# Patient Record
Sex: Male | Born: 1965 | Race: Black or African American | Hispanic: No | Smoking: Never smoker
Health system: Southern US, Community
[De-identification: ages and names within clinical notes are randomized; demographics above are authoritative.]

## PROBLEM LIST (undated history)

## (undated) DIAGNOSIS — R3915 Urgency of urination: Secondary | ICD-10-CM

## (undated) DIAGNOSIS — N411 Chronic prostatitis: Secondary | ICD-10-CM

## (undated) DIAGNOSIS — E669 Obesity, unspecified: Secondary | ICD-10-CM

## (undated) HISTORY — DX: Obesity, unspecified: E66.9

## (undated) HISTORY — DX: Chronic prostatitis: N41.1

## (undated) HISTORY — DX: Urgency of urination: R39.15

---

## 1999-05-04 ENCOUNTER — Encounter: Admission: RE | Admit: 1999-05-04 | Discharge: 1999-05-04 | Payer: Self-pay | Admitting: General Practice

## 1999-05-04 ENCOUNTER — Encounter: Payer: Self-pay | Admitting: General Practice

## 2001-12-05 ENCOUNTER — Encounter: Admission: RE | Admit: 2001-12-05 | Discharge: 2001-12-05 | Payer: Self-pay | Admitting: Internal Medicine

## 2001-12-05 ENCOUNTER — Encounter: Payer: Self-pay | Admitting: Internal Medicine

## 2003-05-19 ENCOUNTER — Emergency Department (HOSPITAL_COMMUNITY): Admission: EM | Admit: 2003-05-19 | Discharge: 2003-05-19 | Payer: Self-pay

## 2004-07-11 ENCOUNTER — Emergency Department (HOSPITAL_COMMUNITY): Admission: EM | Admit: 2004-07-11 | Discharge: 2004-07-12 | Payer: Self-pay | Admitting: Emergency Medicine

## 2012-07-09 ENCOUNTER — Ambulatory Visit
Admission: RE | Admit: 2012-07-09 | Discharge: 2012-07-09 | Disposition: A | Discharge status: Home / Self Care | Payer: No Typology Code available for payment source | Source: Ambulatory Visit | Attending: General Practice | Admitting: General Practice

## 2012-07-09 ENCOUNTER — Other Ambulatory Visit: Payer: Self-pay | Admitting: General Practice

## 2012-07-09 DIAGNOSIS — R52 Pain, unspecified: Secondary | ICD-10-CM

## 2012-07-09 DIAGNOSIS — T148XXA Other injury of unspecified body region, initial encounter: Secondary | ICD-10-CM

## 2012-07-29 ENCOUNTER — Ambulatory Visit: Payer: Self-pay

## 2012-07-30 ENCOUNTER — Ambulatory Visit: Payer: Self-pay | Attending: General Practice | Admitting: Physical Therapy

## 2012-07-30 DIAGNOSIS — R293 Abnormal posture: Secondary | ICD-10-CM | POA: Insufficient documentation

## 2012-07-30 DIAGNOSIS — M2550 Pain in unspecified joint: Secondary | ICD-10-CM | POA: Insufficient documentation

## 2012-07-30 DIAGNOSIS — IMO0001 Reserved for inherently not codable concepts without codable children: Secondary | ICD-10-CM | POA: Insufficient documentation

## 2012-07-31 ENCOUNTER — Ambulatory Visit: Payer: Self-pay | Admitting: Physical Therapy

## 2012-08-04 ENCOUNTER — Ambulatory Visit: Payer: Self-pay | Admitting: Physical Therapy

## 2012-08-06 ENCOUNTER — Ambulatory Visit: Payer: Self-pay | Admitting: Rehabilitation

## 2012-08-11 ENCOUNTER — Ambulatory Visit: Payer: No Typology Code available for payment source | Attending: General Practice | Admitting: Rehabilitation

## 2012-08-11 DIAGNOSIS — IMO0001 Reserved for inherently not codable concepts without codable children: Secondary | ICD-10-CM | POA: Insufficient documentation

## 2012-08-11 DIAGNOSIS — M2550 Pain in unspecified joint: Secondary | ICD-10-CM | POA: Insufficient documentation

## 2012-08-11 DIAGNOSIS — R293 Abnormal posture: Secondary | ICD-10-CM | POA: Insufficient documentation

## 2012-08-13 ENCOUNTER — Ambulatory Visit: Payer: Self-pay | Admitting: Physical Therapy

## 2012-08-18 ENCOUNTER — Ambulatory Visit: Payer: Self-pay | Admitting: Physical Therapy

## 2012-08-20 ENCOUNTER — Ambulatory Visit: Payer: No Typology Code available for payment source | Admitting: Physical Therapy

## 2012-10-18 ENCOUNTER — Encounter (HOSPITAL_COMMUNITY): Payer: Self-pay | Admitting: Emergency Medicine

## 2012-10-18 ENCOUNTER — Emergency Department (HOSPITAL_COMMUNITY): Payer: Self-pay

## 2012-10-18 ENCOUNTER — Emergency Department (HOSPITAL_COMMUNITY)
Admission: EM | Admit: 2012-10-18 | Discharge: 2012-10-18 | Disposition: A | Discharge status: Home / Self Care | Payer: Self-pay | Attending: Emergency Medicine | Admitting: Emergency Medicine

## 2012-10-18 DIAGNOSIS — R509 Fever, unspecified: Secondary | ICD-10-CM | POA: Insufficient documentation

## 2012-10-18 DIAGNOSIS — R110 Nausea: Secondary | ICD-10-CM | POA: Insufficient documentation

## 2012-10-18 DIAGNOSIS — T372X5A Adverse effect of antimalarials and drugs acting on other blood protozoa, initial encounter: Secondary | ICD-10-CM | POA: Insufficient documentation

## 2012-10-18 DIAGNOSIS — R42 Dizziness and giddiness: Secondary | ICD-10-CM | POA: Insufficient documentation

## 2012-10-18 DIAGNOSIS — R0602 Shortness of breath: Secondary | ICD-10-CM | POA: Insufficient documentation

## 2012-10-18 LAB — CBC
MCH: 28.3 pg (ref 26.0–34.0)
MCV: 82.9 fL (ref 78.0–100.0)
Platelets: 233 10*3/uL (ref 150–400)
RDW: 13.2 % (ref 11.5–15.5)

## 2012-10-18 LAB — COMPREHENSIVE METABOLIC PANEL
AST: 33 U/L (ref 0–37)
Albumin: 3.6 g/dL (ref 3.5–5.2)
Calcium: 10 mg/dL (ref 8.4–10.5)
Creatinine, Ser: 1.18 mg/dL (ref 0.50–1.35)
Sodium: 137 mEq/L (ref 135–145)
Total Protein: 7.5 g/dL (ref 6.0–8.3)

## 2012-10-18 MED ORDER — ATOVAQUONE-PROGUANIL HCL 250-100 MG PO TABS: 4.0000 | ORAL_TABLET | Freq: Every day | ORAL | Status: AC

## 2012-10-18 MED ORDER — ATOVAQUONE-PROGUANIL HCL 250-100 MG PO TABS
4.0000 | ORAL_TABLET | Freq: Once | ORAL | Status: AC
  Administered 2012-10-18: 4 via ORAL
  Filled 2012-10-18 (×2): qty 4

## 2012-10-18 MED ORDER — NON FORMULARY: 1.0000 g | Status: DC

## 2012-10-18 MED ORDER — SODIUM CHLORIDE 0.9 % IV SOLN
Freq: Once | INTRAVENOUS | Status: AC
  Administered 2012-10-18: 05:00:00 via INTRAVENOUS

## 2012-10-18 NOTE — ED Provider Notes (Signed)
History    CSN: [id number] Arrival date & time 10/18/12  0356  First MD Initiated Contact with Patient 10/18/12 [phone]     Chief Complaint  Patient presents with  . Diarrhea  . Shortness of Breath   (Consider location/radiation/quality/duration/timing/severity/associated sxs/prior Treatment) HPI History provided by patient. On off fevers for the last 2-3 weeks. Travel from [place] 12 days ago and saw primary care physician in the clinic and was started on antimalarial medications. Fevers are recurrent without measured MAXIMUM TEMPERATURE.  Had some stomach upset after her first dose of antimalarial medication, Larium.  After he took the second dose developed diarrhea and persistent stomach upset. He presents today with some dizziness, not feeling well and concern about this medication. No emesis. Symptoms moderate in severity. No blood in stools. No known sick contacts.  History reviewed. No pertinent past medical history. History reviewed. No pertinent past surgical history. History reviewed. No pertinent family history. History  Substance Use Topics  . Smoking status: Never Smoker   . Smokeless tobacco: Never Used  . Alcohol Use: No    Review of Systems  Constitutional: Positive for fever.  HENT: Negative for neck pain and neck stiffness.   Eyes: Negative for visual disturbance.  Respiratory: Negative for cough and wheezing.   Cardiovascular: Negative for palpitations.  Gastrointestinal: Positive for nausea. Negative for blood in stool and abdominal distention.  Genitourinary: Negative for dysuria.  Musculoskeletal: Negative for back pain.  Skin: Negative for rash.  Neurological: Negative for headaches.  All other systems reviewed and are negative.    Allergies  Review of patient's allergies indicates no known allergies.  Home Medications   Current Outpatient Rx  Name  Route  Sig  Dispense  Refill  . acetaminophen (TYLENOL) 500 MG tablet   Oral   Take 500 mg by  mouth every 6 (six) hours as needed for pain.         . ciprofloxacin (CIPRO) 500 MG tablet   Oral   Take 500 mg by mouth 2 (two) times daily.         . mefloquine (LARIAM) 250 MG tablet   Oral   Take 1,250 mg by mouth once.          BP 145/83  Pulse 75  Temp(Src) 98.6 F (37 C) (Oral)  Resp 20  SpO2 97% Physical Exam  Constitutional: He is oriented to person, place, and time. He appears well-developed and well-nourished.  HENT:  Head: Normocephalic and atraumatic.  Eyes: EOM are normal. Pupils are equal, round, and reactive to light.  Neck: Neck supple.  Cardiovascular: Normal rate, regular rhythm and intact distal pulses.   Pulmonary/Chest: Effort normal and breath sounds normal. No respiratory distress.  Abdominal: Soft. Bowel sounds are normal. He exhibits no distension. There is no tenderness. There is no rebound and no guarding.  Musculoskeletal: Normal range of motion. He exhibits no edema.  Neurological: He is alert and oriented to person, place, and time.  Skin: Skin is warm and dry. No rash noted.    ED Course  Procedures (including critical care time)     Date: 10/18/2012  Rate: 67  Rhythm: normal sinus rhythm  QRS Axis: normal  Intervals: normal  ST/T Wave abnormalities: nonspecific ST changes  Conduction Disutrbances:none  Narrative Interpretation:   Old EKG Reviewed: none available    Dg Chest Portable 1 View  10/18/2012   *RADIOLOGY REPORT*  Clinical Data: Five episodes of watery stools.  Difficulty breathing.  The  patient is being medicated formal area after trip to [place].  PORTABLE CHEST - 1 VIEW  Comparison: None.  Findings: Shallow inspiration.  Heart size and pulmonary vascularity are borderline, likely normal for technique.  No focal consolidation or airspace disease.  No blunting of costophrenic angles.  No pneumothorax.  IMPRESSION: Shallow inspiration.  No evidence of active disease.   Original Report Authenticated By: Burman Nieves,  M.D.   6:41 AM d/w ID Dr Ninetta Lights, recs Malarone and stat read on blood smear. Can followup in travel clinic as needed.   6:44 AM discussed with lab. The smear is a send out and estimated turnaround time is about 3 hours  Plan discharge home prescription for Malarone 1g/400mg  daily x 3 days  MDM  Presumed malaria/ smear pending  IV fluids.   Labs and imaging as above  ID consult     Sunnie Nielsen, MD 10/19/12 [phone]

## 2012-10-18 NOTE — ED Notes (Signed)
Pharmacy called, medication should be available within the hour.
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
## 2012-10-18 NOTE — ED Notes (Signed)
Pt will be discharged after receiving initial dose of malarone

## 2012-10-20 LAB — MALARIA SMEAR

## 2013-05-04 ENCOUNTER — Emergency Department: Payer: Self-pay | Admitting: Emergency Medicine

## 2013-05-04 LAB — URINALYSIS, COMPLETE
Bacteria: NONE SEEN
Bilirubin,UR: NEGATIVE
Blood: NEGATIVE
Glucose,UR: NEGATIVE mg/dL (ref 0–75)
KETONE: NEGATIVE
LEUKOCYTE ESTERASE: NEGATIVE
Nitrite: NEGATIVE
Ph: 6 (ref 4.5–8.0)
Protein: NEGATIVE
RBC,UR: 1 /HPF (ref 0–5)
Specific Gravity: 1.023 (ref 1.003–1.030)
Squamous Epithelial: NONE SEEN
WBC UR: <1 /HPF (ref 0–5)

## 2013-05-04 LAB — DRUG SCREEN, URINE

## 2013-05-04 LAB — COMPREHENSIVE METABOLIC PANEL
ALBUMIN: 3.6 g/dL (ref 3.4–5.0)
ALT: 33 U/L (ref 12–78)
AST: 39 U/L — AB (ref 15–37)
Alkaline Phosphatase: 66 U/L
Anion Gap: 4 — ABNORMAL LOW (ref 7–16)
BUN: 17 mg/dL (ref 7–18)
Bilirubin,Total: 0.2 mg/dL (ref 0.2–1.0)
CALCIUM: 8.9 mg/dL (ref 8.5–10.1)
CREATININE: 1.19 mg/dL (ref 0.60–1.30)
Chloride: 110 mmol/L — ABNORMAL HIGH (ref 98–107)
Co2: 25 mmol/L (ref 21–32)
Glucose: 79 mg/dL (ref 65–99)
Osmolality: 278 (ref 275–301)
Potassium: 3.8 mmol/L (ref 3.5–5.1)
SODIUM: 139 mmol/L (ref 136–145)
Total Protein: 7.2 g/dL (ref 6.4–8.2)

## 2013-05-04 LAB — CBC
HCT: 41.6 % (ref 40.0–52.0)
HGB: 13.8 g/dL (ref 13.0–18.0)
MCH: 29.1 pg (ref 26.0–34.0)
MCHC: 33.3 g/dL (ref 32.0–36.0)
MCV: 88 fL (ref 80–100)
PLATELETS: 194 10*3/uL (ref 150–440)
RBC: 4.76 10*6/uL (ref 4.40–5.90)
RDW: 13.4 % (ref 11.5–14.5)
WBC: 4.8 10*3/uL (ref 3.8–10.6)

## 2013-05-04 LAB — ETHANOL: Ethanol: <3 mg/dL

## 2014-04-09 DIAGNOSIS — R7303 Prediabetes: Secondary | ICD-10-CM

## 2014-04-09 HISTORY — DX: Prediabetes: R73.03
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
## 2015-04-12 ENCOUNTER — Emergency Department (HOSPITAL_COMMUNITY): Payer: BLUE CROSS/BLUE SHIELD

## 2015-04-12 ENCOUNTER — Encounter (HOSPITAL_COMMUNITY): Payer: Self-pay | Admitting: Emergency Medicine

## 2015-04-12 ENCOUNTER — Emergency Department (HOSPITAL_COMMUNITY)
Admission: EM | Admit: 2015-04-12 | Discharge: 2015-04-12 | Disposition: A | Discharge status: Home / Self Care | Payer: BLUE CROSS/BLUE SHIELD | Attending: Emergency Medicine | Admitting: Emergency Medicine

## 2015-04-12 DIAGNOSIS — R079 Chest pain, unspecified: Secondary | ICD-10-CM | POA: Diagnosis present

## 2015-04-12 DIAGNOSIS — R51 Headache: Secondary | ICD-10-CM | POA: Diagnosis not present

## 2015-04-12 DIAGNOSIS — Z791 Long term (current) use of non-steroidal anti-inflammatories (NSAID): Secondary | ICD-10-CM | POA: Diagnosis not present

## 2015-04-12 DIAGNOSIS — M549 Dorsalgia, unspecified: Secondary | ICD-10-CM | POA: Insufficient documentation

## 2015-04-12 LAB — CBC
HEMATOCRIT: 47.3 % (ref 39.0–52.0)
Hemoglobin: 15.8 g/dL (ref 13.0–17.0)
MCH: 28.5 pg (ref 26.0–34.0)
MCHC: 33.4 g/dL (ref 30.0–36.0)
MCV: 85.4 fL (ref 78.0–100.0)
PLATELETS: 219 10*3/uL (ref 150–400)
RBC: 5.54 MIL/uL (ref 4.22–5.81)
RDW: 13.2 % (ref 11.5–15.5)
WBC: 4.7 10*3/uL (ref 4.0–10.5)

## 2015-04-12 LAB — BASIC METABOLIC PANEL
Anion gap: 7 (ref 5–15)
BUN: 15 mg/dL (ref 6–20)
CHLORIDE: 104 mmol/L (ref 101–111)
CO2: 29 mmol/L (ref 22–32)
CREATININE: 1.29 mg/dL — AB (ref 0.61–1.24)
Calcium: 9.3 mg/dL (ref 8.9–10.3)
GFR calc Af Amer: >60 mL/min (ref >60)
GFR calc non Af Amer: >60 mL/min (ref >60)
GLUCOSE: 91 mg/dL (ref 65–99)
Potassium: 4.3 mmol/L (ref 3.5–5.1)
Sodium: 140 mmol/L (ref 135–145)

## 2015-04-12 LAB — I-STAT TROPONIN, ED: TROPONIN I, POC: 0 ng/mL (ref 0.00–0.08)

## 2015-04-12 NOTE — Discharge Instructions (Signed)
°Emergency Department Resource Guide °1) Find a Doctor and Pay Out of Pocket °Although you won't have to find out who is covered by your insurance plan, it is a good idea to ask around and get recommendations. You will then need to call the office and see if the doctor you have chosen will accept you as a new patient and what types of options they offer for patients who are self-pay. Some doctors offer discounts or will set up payment plans for their patients who do not have insurance, but you will need to ask so you aren't surprised when you get to your appointment. ° °2) Contact Your Local Health Department °Not all health departments have doctors that can see patients for sick visits, but many do, so it is worth a call to see if yours does. If you don't know where your local health department is, you can check in your phone book. The CDC also has a tool to help you locate your state's health department, and many state websites also have listings of all of their local health departments. ° °3) Find a Walk-in Clinic °If your illness is not likely to be very severe or complicated, you may want to try a walk in clinic. These are popping up all over the country in pharmacies, drugstores, and shopping centers. They're usually staffed by nurse practitioners or physician assistants that have been trained to treat common illnesses and complaints. They're usually fairly quick and inexpensive. However, if you have serious medical issues or chronic medical problems, these are probably not your best option. ° °No Primary Care Doctor: °- Call Health Connect at  832-8000 - they can help you locate a primary care doctor that  accepts your insurance, provides certain services, etc. °- Physician Referral Service- 1-800-533-3463 ° °Chronic Pain Problems: °Organization         Address  Phone   Notes  °[facility]  ([phone]) [phone] Patients need to be referred by their primary care doctor.  ° °Medication  Assistance: °Organization         Address  Phone   Notes  °[place] [facility] [address] °[place], [place] [address] (336) 641-8030 --Must be a resident of [place] °-- Must have NO insurance coverage whatsoever (no Medicaid/ Medicare, etc.) °-- The pt. MUST have a primary care doctor that directs their care regularly and follows them in the community °  °[facility]  (866) 331-1348   °United Way  (888) 892-1162   ° °Agencies that provide inexpensive medical care: °Organization         Address  Phone   Notes  °[facility]  (336) 832-8035   °[facility]    (336) 832-7272   °[facility] [address] °[place], [place] [address] (336) 832-4777   °[facility] [address], °[place] (336) 271-4999   °[facility]    (336) 373-0678   °[facility]    (336) 272-1050   °[facility] ° [address], [place] Phone:  (336) 832-4444, Fax:  (336) 832-4440 Hours of Operation:  9 am - 6 pm, M-F.  Also accepts Medicaid/Medicare and self-pay.  °[facility] ° [address], [place] Phone: (336) 832-3150, Fax: (336) [phone]. Hours of Operation:  8:30 am - 5:30 pm, M-F.  Also accepts Medicaid and self-pay.  °[facility] [place] [address], [place] Phone: (336) 878-6027   °[facility] [address], [place], [place] (336)723-1848, Ext. 123 Mondays & Thursdays: 7-9 AM.  First 15 patients are seen on a first come, first serve basis. °  ° °Medicaid-accepting [place] Providers: ° °Organization         Address  Phone   Notes  °[facility] [address], [place] (336) 641-2100 Also accepts self-pay patients.  °[facility] [address], [place] ° (336) 856-9996   °[facility] [address], [place]  (336) 288-8857   °[facility] [address] [place] [address], [place] (336) 299-7000   °Veita Bland [address], [place]  ° (336) 373-1557 Only accepts [place] Access Medicaid patients after they have their name applied to their card.  ° °Self-Pay (no insurance) in [place]: ° °Organization         Address  Phone   Notes  °Sickle Cell Patients, [facility] [address], [place] (336) 832-1970   °[facility] [address], [place] (336) 832-4400   °[facility] ° [address], [place] (336) 992-4800   °[facility]/Dr. Osei-Bonsu ° [address] [place] [address], [place] or [address], [place] (336) 841-8500 Phone number for both [place] and [place] locations is the same.  °[facility] [address], [place] (336) 299-0000   °[facility] [address] [place] [address], [place] or [address] (336) 852-7530 °(336) 878-2260   °[facility] [address], [place] (336) 350-1642, phone; (336) 294-5005, fax Sees patients 1st and 3rd Saturday of every month.  Must not qualify for public or private insurance (i.e. Medicaid, Medicare, [place] Health Choice, Veterans' Benefits) • Household income should be no more than 200% of the poverty level •The clinic cannot treat you if you are pregnant or think you are pregnant • Sexually transmitted diseases are not treated at the clinic.  ° ° °Dental Care: °Organization         Address  Phone  Notes  °[place] [facility] [address], [place] (336) 641-6152 Accepts children up to age 21 who are enrolled in Medicaid or [place] Health Choice; pregnant women with a Medicaid card; and children who have applied for Medicaid or [place] Health Choice, but were declined, whose parents can pay a reduced fee at time of service.  °[place]  [facility] [place]  [address], [place] (336) 641-7733 Accepts children up to age 21 who are enrolled in Medicaid or [place] Health Choice; pregnant women with a Medicaid card; and children who have applied for Medicaid or [place] Health Choice, but were declined, whose parents can pay a reduced fee at time of service.  °[facility] ° [address], [place] (336) 641-4533 Patients are seen by appointment only. Walk-ins are not accepted. [facility] will see patients 18 years of age and older. °Monday - Tuesday (8am-5pm) °Most Wednesdays (8:30-5pm) °$30 per visit, cash only  °[facility] ° [address], [place] (336) 641-4533 Patients are seen by appointment only. Walk-ins are not accepted. [facility] will see patients 18 years of age and older. °One   Wednesday Evening (Monthly: Volunteer Based).  $30 per visit, cash only  °[facility]  (919) 537-3737 for adults; Children under age 4, call Graduate Pediatric Dentistry at ([phone]) [phone]. Children aged 4-14, please call ([phone]) [phone] to request a pediatric application. ° Dental services are provided in all areas of dental care including fillings, crowns and bridges, complete and partial dentures, implants, gum treatment, root canals, and extractions. Preventive care is also provided. Treatment is provided to both adults and children. °Patients are selected via a lottery and there is often a waiting list. °  °[facility] [address], °[place] ° (336) 763-8833 [email or web address] °  °[facility] [address], [place], [place] (336)723-1848, Ext. 123 Second and Fourth Thursday of each month, opens at 6:30 AM; Clinic ends at 9 AM.  Patients are seen on a first-come first-served basis, and a limited number are seen during each clinic.  ° °[facility] ° [address], [place], [place] (336) 723-7904    Eligibility Requirements °You must have lived in [place], [place], or [place] counties for at least the last three months. °  You cannot be eligible for state or federal sponsored healthcare insurance, including Veterans Administration, Medicaid, or Medicare. °  You generally cannot be eligible for healthcare insurance through your employer.  °  How to apply: °Eligibility screenings are held every Tuesday and Wednesday afternoon from 1:00 pm until 4:00 pm. You do not need an appointment for the interview!  °[facility] [address], [place], [place] 336-631-2330   °[facility]  336-342-8273   °[facility]  336-703-3100   °[facility]  336-570-6415   ° °Behavioral Health Resources in the Community: °Intensive Outpatient Programs °Organization         Address  Phone  Notes  °[facility] [address], [place], [place] 336-878-6098   °[facility] [address], [place], [place] 336-832-9800   °[facility] [address], [place], [place] ° 336-882-2125   °[facility] [address],  °[place], [place] 1-800-853-5163 or 336-641-4981   °Substance Abuse Resources °Organization         Address  Phone  Notes  °[facility]  336-882-2125   °[facility]  336-784-9470   °[facility]  336-285-9073   °[facility]  336-845-3988   °Residential & Outpatient Substance Abuse Program  1-800-659-3381   °Psychological Services °Organization         Address  Phone  Notes  °[facility]  336- 832-9600   °[facility]  336- 378-7881   °[facility] [address], [place] 1-800-853-5163 or 336-641-4981   ° °Mobile Crisis Teams °Organization         Address  Phone  Notes  °[facility], Mobile Crisis Care Unit  1-877-626-1772   °Assertive °[facility] ° [address]  [place], [place] 336-834-9664   °Sharon DeEsch [address] °[place] 336-554-5454   ° °Self-Help/Support Groups °Organization         Address  Phone             Notes  °[facility] of  - variety of support groups  336- 373-1402 Call for more information  °Narcotics Anonymous (NA), [facility] [address], °[place]  2 meetings at this location  ° °  Residential Treatment Programs Organization         Address  Phone  Notes  [facility] [address],    [place]  [phone]   [facility]  [address], [place] [phone], [place], [place] [phone]   [facility] [address], [place] [phone]) [phone] Admissions: 8am-3pm M-F  [facility] [address]    [place], [place] [phone]   [facility] [address], [place], [place] [phone]) [phone]   [facility] [address]  [place], [place] [phone]   [facility] Intensive Outpatient [address], [place], [place] [phone]   [facility] ([facility] [address]  [place], [place] [phone] or [phone]   [facility] ([facility]) [address] [place], [place] [phone] Accepts Medicaid  [facility] [address]  [place] [phone] Substance Abuse/Addiction Treatment   [facility] Organization         Address  Phone  Notes  [facility]  [phone](888) [phone]   Angie FavaJulie Brannon, PhD [address], [address] [place], [place]   (509) [phone](336) [phone] or [phone](336) ([phone]   [facility]   [address] [place], [place] ([phone]) [phone](336) ([phone]   [facility] [address], [place], [place] [phone](336) [phone] Insurance/Medicaid/sponsorship through [facility]  [facility] [address]                                    [place], [place] [phone](336) [phone] Therapy/tele-psych/case    [facility] [address]   [place], [place] [phone](336) [phone]    Dr. Lolly MustacheArfeen  [phone](336) [phone]   [facility]  [employer] [facility] 1) [address], [place] 2) [address], [place] 3)  [address], [place] [phone](336) [phone](336) [phone]  [phone](336) [phone]   [facility] Child Abuse Hotline 727-454-4386(336) (437)[phone] or (581) [phone](336) [phone] (After Hours)      Return for any new or worse symptoms. Make an appointment to follow-up with the wellness clinic. Resource guide provided to help you find a regular doctor. Today's workup without any significant findings to explain the chest pain.

## 2015-04-12 NOTE — ED Notes (Addendum)
Pt states he's been to his PCP and complained of chest pain x 3 years. States the medication they give him for the pain gives him heart burn and he doesn't like it, so he decided to come here today for the chronic chest pain instead. Denies SOB, N/V/D, fever/chills.  PCP told him he had a muscular strain for the last 3 years

## 2015-04-12 NOTE — ED Provider Notes (Signed)
CSN: [id number]     Arrival date & time 04/12/15  1624 History   First MD Initiated Contact with Patient 04/12/15 2052     Chief Complaint  Patient presents with  . Chest Pain     (Consider location/radiation/quality/duration/timing/severity/associated sxs/prior Treatment) Patient is a 50 y.o. male presenting with chest pain. The history is provided by the patient.  Chest Pain Associated symptoms: back pain and headache   Associated symptoms: no abdominal pain, no fever, no nausea, no shortness of breath and not vomiting   patient presents with complaint of chest pain that started some left-sided chest goes to the right and into the right back. This is been ongoing on and off for 3 years. His present for several days lately. Been treated as a muscle strain in the past but not ever resolved. Not associated with shortness of breath no nausea vomiting or diarrhea no fever or chills. No history of any cardiac problems in the past. Used to have a primary care doctor but has not seen in over a year and believes his primary care doctor is no longer in the area. Patient describes the pain is an ache and sometimes sharp. Currently it is 5 out of 10.  History reviewed. No pertinent past medical history. History reviewed. No pertinent past surgical history. History reviewed. No pertinent family history. Social History  Substance Use Topics  . Smoking status: Never Smoker   . Smokeless tobacco: Never Used  . Alcohol Use: No    Review of Systems  Constitutional: Negative for fever.  HENT: Negative for congestion.   Eyes: Negative for visual disturbance.  Respiratory: Negative for shortness of breath.   Cardiovascular: Positive for chest pain. Negative for leg swelling.  Gastrointestinal: Negative for nausea, vomiting and abdominal pain.  Genitourinary: Negative for dysuria.  Musculoskeletal: Positive for back pain.  Skin: Negative for rash.  Neurological: Positive for headaches.   Hematological: Does not bruise/bleed easily.  Psychiatric/Behavioral: Negative for confusion.      Allergies  Review of patient's allergies indicates no known allergies.  Home Medications   Prior to Admission medications   Medication Sig Start Date End Date Taking? Authorizing Provider  diclofenac (VOLTAREN) 75 MG EC tablet Take 75 mg by mouth 2 (two) times daily.   Yes Historical Provider, MD  Ibuprofen (ADVIL MIGRAINE) 200 MG CAPS Take 1 capsule by mouth daily as needed (headache).   Yes Historical Provider, MD  omeprazole (PRILOSEC) 40 MG capsule Take 40 mg by mouth daily as needed (heartburn).   Yes Historical Provider, MD  acetaminophen (TYLENOL) 500 MG tablet Take 500 mg by mouth every 6 (six) hours as needed for pain.    Historical Provider, MD   BP 139/100 mmHg  Pulse 78  Temp(Src) 97.8 F (36.6 C) (Oral)  Resp 16  SpO2 98% Physical Exam  Constitutional: He is oriented to person, place, and time. He appears well-developed and well-nourished. No distress.  HENT:  Head: Normocephalic and atraumatic.  Mouth/Throat: Oropharynx is clear and moist.  Eyes: Conjunctivae and EOM are normal. Pupils are equal, round, and reactive to light.  Neck: Normal range of motion. Neck supple.  Cardiovascular: Normal rate, regular rhythm and normal heart sounds.   No murmur heard. Pulmonary/Chest: Effort normal and breath sounds normal. No respiratory distress.  Abdominal: Soft. Bowel sounds are normal. There is no tenderness.  Musculoskeletal: Normal range of motion. He exhibits no edema.  Neurological: He is alert and oriented to person, place, and time. No cranial  nerve deficit. He exhibits normal muscle tone. Coordination normal.  Skin: Skin is warm.  Nursing note and vitals reviewed.   ED Course  Procedures (including critical care time) Labs Review Labs Reviewed  BASIC METABOLIC PANEL - Abnormal; Notable for the following:    Creatinine, Ser 1.29 (*)    All other components  within normal limits  CBC  I-STAT TROPOININ, ED   Results for orders placed or performed during the hospital encounter of 04/12/15  Basic metabolic panel  Result Value Ref Range   Sodium 140 135 - 145 mmol/L   Potassium 4.3 3.5 - 5.1 mmol/L   Chloride 104 101 - 111 mmol/L   CO2 29 22 - 32 mmol/L   Glucose, Bld 91 65 - 99 mg/dL   BUN 15 6 - 20 mg/dL   Creatinine, Ser 1.61 (H) 0.61 - 1.24 mg/dL   Calcium 9.3 8.9 - 09.6 mg/dL   GFR calc non Af Amer >60 >60 mL/min   GFR calc Af Amer >60 >60 mL/min   Anion gap 7 5 - 15  CBC  Result Value Ref Range   WBC 4.7 4.0 - 10.5 K/uL   RBC 5.54 4.22 - 5.81 MIL/uL   Hemoglobin 15.8 13.0 - 17.0 g/dL   HCT 04.5 40.9 - 81.1 %   MCV 85.4 78.0 - 100.0 fL   MCH 28.5 26.0 - 34.0 pg   MCHC 33.4 30.0 - 36.0 g/dL   RDW 91.4 78.2 - 95.6 %   Platelets 219 150 - 400 K/uL  I-stat troponin, ED (not at [facility])  Result Value Ref Range   Troponin i, poc 0.00 0.00 - 0.08 ng/mL   Comment 3             Imaging Review Dg Chest 2 View  04/12/2015  CLINICAL DATA:  Right chest pain. EXAM: CHEST  2 VIEW COMPARISON:  05/05/2013. FINDINGS: Normal sized heart. Clear lungs with normal vascularity. Minimal lower thoracic spine degenerative changes. IMPRESSION: No acute abnormality. Electronically Signed   By: Beckie Salts M.D.   On: 04/12/2015 17:02   I have personally reviewed and evaluated these images and lab results as part of my medical decision-making.   EKG Interpretation   Date/Time:  Tuesday April 12 2015 16:45:33 EST Ventricular Rate:  74 PR Interval:  175 QRS Duration: 90 QT Interval:  377 QTC Calculation: 418 R Axis:   5 Text Interpretation:  Sinus rhythm Confirmed by Acelynn Dejonge  MD, [place] Kappes  ([id number]) on 04/12/2015 9:34:37 PM      MDM   Final diagnoses:  Chest pain, unspecified chest pain type    Workup for the chest pain without any acute findings.  Patient states is been having symptoms on and off for about 3 years. Said symptoms for  several days currently. Patient currently does not have a primary care doctor. Referral to wellness clinic and resource guide provided to find follow-up. Patient's been treated with ibuprofen Naprosyn and Voltaren in the past helps somewhat while taking it but does not resolve the symptoms. Patient stable for discharge home and follow-up. Blood pressure here today with questionable elevation of diastolic pressures follow-up of the blood pressure would be important. There may be a history of hypertension there is some worsening of his creatinine.    Vanetta Mulders, MD 04/12/15 2138

## 2017-06-11 ENCOUNTER — Encounter (HOSPITAL_COMMUNITY): Payer: Self-pay | Admitting: Emergency Medicine

## 2017-06-11 DIAGNOSIS — R350 Frequency of micturition: Secondary | ICD-10-CM | POA: Diagnosis not present

## 2017-06-11 DIAGNOSIS — R0789 Other chest pain: Secondary | ICD-10-CM | POA: Diagnosis not present

## 2017-06-11 NOTE — ED Triage Notes (Addendum)
Patient c/o urinary frequency x8 months. Dx with prostate enlargement in [place] and prescribed flomax. Also reports ordering Prostagenix online with minimal relief. Reports increased urination at night with difficulty urinating "more than a couple drops." Denies pain with urination.

## 2017-06-12 ENCOUNTER — Emergency Department (HOSPITAL_COMMUNITY)
Admission: EM | Admit: 2017-06-12 | Discharge: 2017-06-12 | Disposition: A | Discharge status: Home / Self Care | Payer: BLUE CROSS/BLUE SHIELD | Attending: Emergency Medicine | Admitting: Emergency Medicine

## 2017-06-12 DIAGNOSIS — R079 Chest pain, unspecified: Secondary | ICD-10-CM

## 2017-06-12 DIAGNOSIS — R350 Frequency of micturition: Secondary | ICD-10-CM

## 2017-06-12 LAB — CBC WITH DIFFERENTIAL/PLATELET
Basophils Absolute: 0 10*3/uL (ref 0.0–0.1)
Basophils Relative: 1 %
Eosinophils Absolute: 0.4 10*3/uL (ref 0.0–0.7)
Eosinophils Relative: 7 %
HEMATOCRIT: 47.3 % (ref 39.0–52.0)
HEMOGLOBIN: 15.9 g/dL (ref 13.0–17.0)
LYMPHS ABS: 2.5 10*3/uL (ref 0.7–4.0)
Lymphocytes Relative: 40 %
MCH: 29.3 pg (ref 26.0–34.0)
MCHC: 33.6 g/dL (ref 30.0–36.0)
MCV: 87.3 fL (ref 78.0–100.0)
MONOS PCT: 8 %
Monocytes Absolute: 0.5 10*3/uL (ref 0.1–1.0)
NEUTROS ABS: 2.9 10*3/uL (ref 1.7–7.7)
NEUTROS PCT: 46 %
Platelets: 213 10*3/uL (ref 150–400)
RBC: 5.42 MIL/uL (ref 4.22–5.81)
RDW: 13.3 % (ref 11.5–15.5)
WBC: 6.4 10*3/uL (ref 4.0–10.5)

## 2017-06-12 LAB — BASIC METABOLIC PANEL
ANION GAP: 9 (ref 5–15)
BUN: 18 mg/dL (ref 6–20)
CHLORIDE: 103 mmol/L (ref 101–111)
CO2: 25 mmol/L (ref 22–32)
Calcium: 9.1 mg/dL (ref 8.9–10.3)
Creatinine, Ser: 1.19 mg/dL (ref 0.61–1.24)
GFR calc non Af Amer: >60 mL/min (ref >60)
Glucose, Bld: 101 mg/dL — ABNORMAL HIGH (ref 65–99)
Potassium: 3.8 mmol/L (ref 3.5–5.1)
Sodium: 137 mmol/L (ref 135–145)

## 2017-06-12 LAB — URINALYSIS, ROUTINE W REFLEX MICROSCOPIC
Bacteria, UA: NONE SEEN
Bilirubin Urine: NEGATIVE
GLUCOSE, UA: NEGATIVE mg/dL
Hgb urine dipstick: NEGATIVE
Ketones, ur: NEGATIVE mg/dL
LEUKOCYTES UA: NEGATIVE
Nitrite: NEGATIVE
PH: 6 (ref 5.0–8.0)
Protein, ur: NEGATIVE mg/dL
SPECIFIC GRAVITY, URINE: 1.018 (ref 1.005–1.030)
SQUAMOUS EPITHELIAL / LPF: NONE SEEN

## 2017-06-12 LAB — I-STAT TROPONIN, ED: Troponin i, poc: 0 ng/mL (ref 0.00–0.08)

## 2017-06-12 MED ORDER — DUTASTERIDE 0.5 MG PO CAPS: 0.5000 mg | ORAL_CAPSULE | Freq: Every day | ORAL | 0 refills | Status: DC

## 2017-06-12 MED ORDER — DICLOFENAC SODIUM 50 MG PO TBEC: 50.0000 mg | DELAYED_RELEASE_TABLET | Freq: Two times a day (BID) | ORAL | 0 refills | Status: DC

## 2017-06-12 MED ORDER — TAMSULOSIN HCL 0.4 MG PO CAPS: 0.4000 mg | ORAL_CAPSULE | Freq: Every day | ORAL | 0 refills | Status: DC

## 2017-06-12 NOTE — Discharge Instructions (Signed)
Avodart and Flomax as prescribed.  Diclofenac as prescribed.  Follow-up with urology to discuss your urinary issues.  The contact information for [facility] has been provided in this discharge summary for you to call and make these arrangements.

## 2017-06-12 NOTE — ED Provider Notes (Signed)
[facility]-EMERGENCY DEPT Provider Note   CSN: [id number] Arrival date & time: 06/11/17  1827     History   Chief Complaint Chief Complaint  Patient presents with  . Urinary Frequency    HPI Ian Lopez is a 52 y.o. male.  Patient is a 52 year old male with past medical history of enlarged prostate.  He presents today for evaluation of frequent urination.  This seems to happen mainly at night and has to get up several times in the night to urinate.  He denies any fevers, chills, or abdominal pain.  He also reports chest pain that is been ongoing for the past 4 years.  It is worse when he sits forward and moves.  He denies any shortness of breath, cough, diaphoresis, radiation of his pain.  He was started on tamsulosin and Avodart by a urologist while he was in [place] last year.  This helped somewhat, however he is out of this and does not have a local doctor or urologist.   The history is provided by the patient.  Urinary Frequency  This is a new problem. The problem occurs constantly. The problem has not changed since onset.Associated symptoms include chest pain. Pertinent negatives include no abdominal pain and no shortness of breath. Nothing aggravates the symptoms. Nothing relieves the symptoms. He has tried nothing for the symptoms.    History reviewed. No pertinent past medical history.  There are no active problems to display for this patient.   History reviewed. No pertinent surgical history.     Home Medications    Prior to Admission medications   Medication Sig Start Date End Date Taking? Authorizing Provider  acetaminophen (TYLENOL) 500 MG tablet Take 500 mg by mouth every 6 (six) hours as needed for pain.    [provider]  diclofenac (VOLTAREN) 75 MG EC tablet Take 75 mg by mouth 2 (two) times daily.    [provider]  Ibuprofen (ADVIL MIGRAINE) 200 MG CAPS Take 1 capsule by mouth daily as needed (headache).     [provider]  omeprazole (PRILOSEC) 40 MG capsule Take 40 mg by mouth daily as needed (heartburn).    [provider]    Family History No family history on file.  Social History Social History   Tobacco Use  . Smoking status: Never Smoker  . Smokeless tobacco: Never Used  Substance Use Topics  . Alcohol use: No  . Drug use: No     Allergies   Patient has no known allergies.   Review of Systems Review of Systems  Respiratory: Negative for shortness of breath.   Cardiovascular: Positive for chest pain.  Gastrointestinal: Negative for abdominal pain.  Genitourinary: Positive for frequency.  All other systems reviewed and are negative.    Physical Exam Updated Vital Signs BP (!) 149/99 (BP Location: Right Arm)   Pulse 67   Temp (!) 97.4 F (36.3 C) (Oral)   Resp 18   SpO2 100%   Physical Exam  Constitutional: He is oriented to person, place, and time. He appears well-developed and well-nourished. No distress.  HENT:  Head: Normocephalic and atraumatic.  Mouth/Throat: Oropharynx is clear and moist.  Neck: Normal range of motion. Neck supple.  Cardiovascular: Normal rate and regular rhythm. Exam reveals no friction rub.  No murmur heard. Pulmonary/Chest: Effort normal and breath sounds normal. No respiratory distress. He has no wheezes. He has no rales.  Abdominal: Soft. Bowel sounds are normal. He exhibits no distension.  There is no tenderness.  Musculoskeletal: Normal range of motion. He exhibits no edema.  Neurological: He is alert and oriented to person, place, and time. Coordination normal.  Skin: Skin is warm and dry. He is not diaphoretic.  Nursing note and vitals reviewed.    ED Treatments / Results  Labs (all labs ordered are listed, but only abnormal results are displayed) Labs Reviewed  URINE CULTURE  URINALYSIS, ROUTINE W REFLEX MICROSCOPIC  BASIC METABOLIC PANEL  CBC WITH DIFFERENTIAL/PLATELET  I-STAT TROPONIN, ED     EKG  EKG Interpretation  Date/Time:  Wednesday June 12 2017 01:12:17 EST Ventricular Rate:  69 PR Interval:    QRS Duration: 92 QT Interval:  393 QTC Calculation: 421 R Axis:   -143 Text Interpretation:  Sinus or ectopic atrial rhythm Right axis deviation Abnormal T, consider ischemia, lateral leads Confirmed by Geoffery LyonseLo, Aryn Kops ([id number]) on 06/12/2017 2:23:58 AM       Radiology No results found.  Procedures Procedures (including critical care time)  Medications Ordered in ED Medications - No data to display   Initial Impression / Assessment and Plan / ED Course  I have reviewed the triage vital signs and the nursing notes.  Pertinent labs & imaging results that were available during my care of the patient were reviewed by me and considered in my medical decision making (see chart for details).  Patient presents with complaints of urinary frequency and chest pain.  He reports urinary frequency, mainly at night for the past year.  While he was in [place], he was started on Avodart and Flomax.  This seemed to help somewhat, however he has since run out of these medications.  He reports waking up to go the bathroom several times per night.  His urinalysis is clear and renal function is normal.  At this point I see no indication for further workup here into this.  I will have him follow-up with urology to discuss these issues.  I will refill his Avodart and tamsulosin.  He also reports chest pain that he has had intermittently for the past 4 years.  This is sharp in nature and located in the left front of his chest.  It is worse with moving his arms and sitting forward.  He was prescribed clofenac which she was previously taking.  This seemed to help, however he is also out of this medication.  His EKG today shows no acute changes and troponin is negative.  I do not feel as though any further workup is indicated into this.  I highly doubt a cardiac etiology and strongly suspect a  musculoskeletal cause.  Final Clinical Impressions(s) / ED Diagnoses   Final diagnoses:  None    ED Discharge Orders    None       Geoffery Lyonselo, Tensley Wery, MD 06/12/17 [phone]

## 2017-06-13 LAB — URINE CULTURE: Culture: NO GROWTH

## 2019-05-11 DIAGNOSIS — N400 Enlarged prostate without lower urinary tract symptoms: Secondary | ICD-10-CM

## 2019-05-11 HISTORY — DX: Benign prostatic hyperplasia without lower urinary tract symptoms: N40.0

## 2019-07-18 ENCOUNTER — Ambulatory Visit: Payer: Self-pay | Attending: Internal Medicine

## 2019-07-18 DIAGNOSIS — Z23 Encounter for immunization: Secondary | ICD-10-CM

## 2019-07-18 NOTE — Progress Notes (Signed)
   Covid-19 Vaccination Clinic  Name:  Ian Lopez    MRN: [id number] DOB: [birth year]  07/18/2019  [name] was observed post Covid-19 immunization for 15 minutes without incident. He was provided with Vaccine Information Sheet and instruction to access the V-Safe system.   [name] was instructed to call 911 with any severe reactions post vaccine: Marland Kitchen Difficulty breathing  . Swelling of face and throat  . A fast heartbeat  . A bad rash all over body  . Dizziness and weakness   Immunizations Administered    Name Date Dose VIS Date Route   Pfizer COVID-19 Vaccine 07/18/2019  1:24 PM 0.3 mL 03/20/2019 Intramuscular   Manufacturer: [employer], [employer]   Lot: [id number]   NDC: [address]-8

## 2019-08-10 ENCOUNTER — Ambulatory Visit: Payer: Self-pay | Attending: Internal Medicine

## 2019-08-10 DIAGNOSIS — Z23 Encounter for immunization: Secondary | ICD-10-CM

## 2019-08-10 NOTE — Progress Notes (Signed)
   Covid-19 Vaccination Clinic  Name:  Ian Lopez    MRN: [id number] DOB: 07-Jul-1965  08/10/2019  Mr. Bazaldua was observed post Covid-19 immunization for 15 minutes without incident. He was provided with Vaccine Information Sheet and instruction to access the V-Safe system.   Mr. Resende was instructed to call 911 with any severe reactions post vaccine: Marland Kitchen Difficulty breathing  . Swelling of face and throat  . A fast heartbeat  . A bad rash all over body  . Dizziness and weakness   Immunizations Administered    Name Date Dose VIS Date Route   Pfizer COVID-19 Vaccine 08/10/2019 12:23 PM 0.3 mL 06/03/2018 Intramuscular   Manufacturer: [employer], [employer]   Lot: [id number]   NDC: [address]-9

## 2019-10-20 ENCOUNTER — Ambulatory Visit: Payer: Self-pay | Admitting: Internal Medicine

## 2019-11-30 ENCOUNTER — Ambulatory Visit: Payer: Self-pay | Admitting: Internal Medicine

## 2020-01-12 ENCOUNTER — Encounter: Payer: Self-pay | Admitting: Clinical

## 2020-01-12 ENCOUNTER — Encounter: Payer: Self-pay | Admitting: Internal Medicine

## 2020-01-12 ENCOUNTER — Ambulatory Visit: Payer: Self-pay | Admitting: Internal Medicine

## 2020-01-12 VITALS — BP 120/80 | HR 60 | Resp 12 | Ht 70.0 in | Wt 219.5 lb

## 2020-01-12 DIAGNOSIS — N529 Male erectile dysfunction, unspecified: Secondary | ICD-10-CM | POA: Insufficient documentation

## 2020-01-12 DIAGNOSIS — N400 Enlarged prostate without lower urinary tract symptoms: Secondary | ICD-10-CM

## 2020-01-12 DIAGNOSIS — R7303 Prediabetes: Secondary | ICD-10-CM | POA: Insufficient documentation

## 2020-01-12 DIAGNOSIS — E669 Obesity, unspecified: Secondary | ICD-10-CM

## 2020-01-12 DIAGNOSIS — J3089 Other allergic rhinitis: Secondary | ICD-10-CM

## 2020-01-12 DIAGNOSIS — R079 Chest pain, unspecified: Secondary | ICD-10-CM

## 2020-01-12 DIAGNOSIS — M79601 Pain in right arm: Secondary | ICD-10-CM

## 2020-01-12 MED ORDER — TAMSULOSIN HCL 0.4 MG PO CAPS: 0.4000 mg | ORAL_CAPSULE | Freq: Every day | ORAL | 11 refills | Status: DC

## 2020-01-12 MED ORDER — LEVOCETIRIZINE DIHYDROCHLORIDE 5 MG PO TABS: 5.0000 mg | ORAL_TABLET | Freq: Every evening | ORAL | 11 refills | Status: DC

## 2020-01-12 MED ORDER — DUTASTERIDE 0.5 MG PO CAPS: 0.5000 mg | ORAL_CAPSULE | Freq: Every day | ORAL | 11 refills | Status: DC

## 2020-01-12 MED ORDER — MELOXICAM 15 MG PO TABS: ORAL_TABLET | ORAL | 11 refills | Status: DC

## 2020-01-12 MED ORDER — FAMOTIDINE 40 MG PO TABS: 40.0000 mg | ORAL_TABLET | Freq: Every day | ORAL | 11 refills | Status: DC

## 2020-01-12 MED ORDER — FLUTICASONE PROPIONATE 50 MCG/ACT NA SUSP: 2.0000 | Freq: Every day | NASAL | 11 refills | Status: DC

## 2020-01-12 NOTE — Progress Notes (Signed)
Subjective:    Patient ID: Ian Lopez, male   DOB: [birth year], 54 y.o.   MRN: [id number]   HPI   Here to establish  1.  Chest pain:  Has had for 7-8 years.  Points to left lower sternal area.  Feels when bends over at times or when twists to right or left with torso.  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   Feels this pain jumped over to his medial and lateral elbow radiating up into his shoulder at times.  This pain started 2 months ago.  Does not recall an injury.   Has been taking Diclofenac prescribed in past.  When he used for one month, the pain resolved--both chest and right arm, but when he stops, the pain recurs.   He needs to take Prilosec when he uses the Diclofenac as gets epigastric burning if he does not.   2.  BPH:  Diagnosed in [place].  He was started on combination medication Tamsulosin 0.4 mg and Dutasteride 0.5 mg daily in [place] in November 2020.  Took until February.   Described nocturia 6-7 times.  Frequency in daytime as well. Had a decreased urine stream and urinary hesitancy as well.   Has not taken any medication for this in February 2021.  He currently has nocturia 3-4 times nightly.  Urinates 4-5 times during the day. Sometimes has decreased stream.  No hesitancy.  3.  ED:  Last 3 times has had intercourse with wife, he is unable to keep an erection.  Unable to orgasm/ejaculate as well.  Last time able to orgasm was 2 months ago.    4.  History of Prediabetes:  No polydipsia or polyuria.  Drinks tea with creamer.  Does not eat much in way of vegetables.  He describes a very starchy diet.    5.  By The [address]:  Congested, sneezing, coughing.  Has allergies.  Taking Zyrtec D, but does not seem to be helping any longer.  Allegra has not helped in past, nor has Claritin.  Not using nasal corticosteroids.  No outpatient medications have been marked as taking for the 01/12/20 encounter (Office Visit) with Julieanne Manson, MD.   No Known  Allergies   Review of Systems    Objective:   BP 120/80 (BP Location: Right Arm, Patient Position: Sitting, Cuff Size: Large)   Pulse 60   Resp 12   Ht 5\' 10"  (1.778 m)   Wt 219 lb 8 oz (99.6 kg)   BMI 31.49 kg/m   Physical Exam  NAD HEENT:  PERRL, EOMI, TMs pearly gray, nasal mucosa boggy with clear discharge.  Unable to see posterior pharynx well.   Neck:  Supple, No adenopathy, no thyromegaly Chest:  CTA.  Tender over lower left costal/sternal margin. CV:  RRR with normal S1 and S2, No S3, S4 or murmur.  No carotid bruits.  Carotid, radial and DP PT pulses normal and equal. Abd:  Protuberant, S, NT, No HSM or mass. + BS Extrems:  No edema.  NT over medial and lateral epicondyles of right elbow.  Full ROM.  No erythema or swelling.   Assessment & Plan  1.  Chest wall pain:  Meloxicam 15 mg daily.  As he gets epigastric burning, will also start Famotidine 40 mg daily. Will treat right arm pain as well.  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  2.  Right elbow pain:  As above.  3.  Obesity/Prediabetes:  Went over diet and how to read nutrition panels.  To decrease carbs in diet. Also, would like him to work on physical activity outside of work.   A1C, FLP, CBC, CMP  4.  Allergies:  Xyzal 5 mg daily and Fluticasone nasal spray, 2 sprays each nostril daily.  5.  BPH:  Restart Tamsulosin and Dutasteride 0.4 and 0.5 mg respectively.  Discussed this is not a cure, but a treatment and will likely need to continue with the latter to control symptoms.  6.  ED:  Checking labs to see if identifiable cause.  Consider Viagra or Cialis thereafter.

## 2020-01-12 NOTE — Progress Notes (Signed)
Social worker met with new patient who is scheduled with Dr. Amil Amen for medical visit. Social worker completed New Patient Questionnaire which included completion of housing, intimate partner violence, transportation needs, stress, Emergency planning/management officer strain, food insecurity and screeners. Social History   Socioeconomic History  . Marital status: Divorced    Spouse name: Ian Lopez  . Number of children: 7  . Years of education: 33  . Highest education level: 12th grade  Occupational History  . Occupation: Transports cars.  Tobacco Use  . Smoking status: Never Smoker  . Smokeless tobacco: Never Used  Substance and Sexual Activity  . Alcohol use: No  . Drug use: No  . Sexual activity: Yes  Other Topics Concern  . Not on file  Social History Narrative   Lives alone and divorced from wife, but they still get together at times.     Ian X wives last names are Clifton Custard is a patient here   Social Determinants of Radio broadcast assistant Strain: Low Risk   . Difficulty of Paying Living Expenses: Not hard at all  Food Insecurity: No Food Insecurity  . Worried About Charity fundraiser in the Last Year: Never true  . Ran Out of Food in the Last Year: Never true  Transportation Needs: No Transportation Needs  . Lack of Transportation (Medical): No  . Lack of Transportation (Non-Medical): No  Physical Activity:   . Days of Exercise per Week: Not on file  . Minutes of Exercise per Session: Not on file  Stress: No Stress Concern Present  . Feeling of Stress : Not at all  Social Connections: Moderately Isolated  . Frequency of Communication with Friends and Family: More than three times a week  . Frequency of Social Gatherings with Friends and Family: Twice a week  . Attends Religious Services: Never  . Active Member of Clubs or Organizations: No  . Attends Archivist Meetings: Never  . Marital Status: Living with partner    Depression screen [facility] 2/9 01/12/2020   Decreased Interest 0  Down, Depressed, Hopeless 0  PHQ - 2 Score 0  Altered sleeping 0  Tired, decreased energy 1  Change in appetite 1  Feeling bad or failure about yourself  0  Trouble concentrating 0  Moving slowly or fidgety/restless 0  Suicidal thoughts 0  PHQ-9 Score 2  Difficult doing work/chores Not difficult at all    GAD 7 : Generalized Anxiety Score 01/12/2020  Nervous, Anxious, on Edge 0  Control/stop worrying 0  Worry too much - different things 0  Trouble relaxing 0  Restless 0  Easily annoyed or irritable 0  Afraid - awful might happen 0  Total GAD 7 Score 0  Anxiety Difficulty Not difficult at all     Based on presentation no symptoms of concern were presented. LCSW informed of counseling services if were to be needed. Patient thanked LCSW.

## 2020-01-13 LAB — COMPREHENSIVE METABOLIC PANEL
ALT: 18 IU/L (ref 0–44)
AST: 23 IU/L (ref 0–40)
Albumin/Globulin Ratio: 1.4 (ref 1.2–2.2)
Albumin: 4.2 g/dL (ref 3.8–4.9)
Alkaline Phosphatase: 81 IU/L (ref 44–121)
BUN/Creatinine Ratio: 15 (ref 9–20)
BUN: 16 mg/dL (ref 6–24)
Bilirubin Total: 0.3 mg/dL (ref 0.0–1.2)
CO2: 24 mmol/L (ref 20–29)
Calcium: 9 mg/dL (ref 8.7–10.2)
Chloride: 104 mmol/L (ref 96–106)
Creatinine, Ser: 1.05 mg/dL (ref 0.76–1.27)
GFR calc Af Amer: 91 mL/min/{1.73_m2} (ref >59)
GFR calc non Af Amer: 79 mL/min/{1.73_m2} (ref >59)
Globulin, Total: 3 g/dL (ref 1.5–4.5)
Glucose: 81 mg/dL (ref 65–99)
Potassium: 4.7 mmol/L (ref 3.5–5.2)
Sodium: 139 mmol/L (ref 134–144)
Total Protein: 7.2 g/dL (ref 6.0–8.5)

## 2020-01-13 LAB — LIPID PANEL W/O CHOL/HDL RATIO
Cholesterol, Total: 170 mg/dL (ref 100–199)
HDL: 31 mg/dL — ABNORMAL LOW (ref >39)
LDL Chol Calc (NIH): 120 mg/dL — ABNORMAL HIGH (ref 0–99)
Triglycerides: 100 mg/dL (ref 0–149)
VLDL Cholesterol Cal: 19 mg/dL (ref 5–40)

## 2020-01-13 LAB — CBC WITH DIFFERENTIAL/PLATELET
Basophils Absolute: 0 10*3/uL (ref 0.0–0.2)
Basos: 1 %
EOS (ABSOLUTE): 0.3 10*3/uL (ref 0.0–0.4)
Eos: 8 %
Hematocrit: 46.8 % (ref 37.5–51.0)
Hemoglobin: 15.4 g/dL (ref 13.0–17.7)
Immature Grans (Abs): 0 10*3/uL (ref 0.0–0.1)
Immature Granulocytes: 0 %
Lymphocytes Absolute: 1.8 10*3/uL (ref 0.7–3.1)
Lymphs: 43 %
MCH: 29.1 pg (ref 26.6–33.0)
MCHC: 32.9 g/dL (ref 31.5–35.7)
MCV: 88 fL (ref 79–97)
Monocytes Absolute: 0.4 10*3/uL (ref 0.1–0.9)
Monocytes: 9 %
Neutrophils Absolute: 1.6 10*3/uL (ref 1.4–7.0)
Neutrophils: 39 %
Platelets: 188 10*3/uL (ref 150–450)
RBC: 5.3 x10E6/uL (ref 4.14–5.80)
RDW: 13 % (ref 11.6–15.4)
WBC: 4.3 10*3/uL (ref 3.4–10.8)

## 2020-01-13 LAB — HGB A1C W/O EAG: Hgb A1c MFr Bld: 5.8 % — ABNORMAL HIGH (ref 4.8–5.6)

## 2020-02-03 DIAGNOSIS — E785 Hyperlipidemia, unspecified: Secondary | ICD-10-CM

## 2020-02-03 DIAGNOSIS — N411 Chronic prostatitis: Secondary | ICD-10-CM | POA: Insufficient documentation

## 2020-02-03 HISTORY — DX: Hyperlipidemia, unspecified: E78.5

## 2020-04-13 ENCOUNTER — Ambulatory Visit: Payer: Self-pay | Admitting: Internal Medicine

## 2020-04-13 ENCOUNTER — Other Ambulatory Visit: Payer: Self-pay

## 2020-04-13 ENCOUNTER — Encounter: Payer: Self-pay | Admitting: Internal Medicine

## 2020-04-13 VITALS — BP 132/90 | HR 60 | Resp 12 | Ht 70.5 in | Wt 216.5 lb

## 2020-04-13 DIAGNOSIS — N529 Male erectile dysfunction, unspecified: Secondary | ICD-10-CM

## 2020-04-13 DIAGNOSIS — R1013 Epigastric pain: Secondary | ICD-10-CM

## 2020-04-13 DIAGNOSIS — R7303 Prediabetes: Secondary | ICD-10-CM

## 2020-04-13 DIAGNOSIS — N400 Enlarged prostate without lower urinary tract symptoms: Secondary | ICD-10-CM

## 2020-04-13 DIAGNOSIS — E785 Hyperlipidemia, unspecified: Secondary | ICD-10-CM

## 2020-04-13 DIAGNOSIS — R079 Chest pain, unspecified: Secondary | ICD-10-CM

## 2020-04-13 DIAGNOSIS — J3089 Other allergic rhinitis: Secondary | ICD-10-CM | POA: Insufficient documentation

## 2020-04-13 MED ORDER — CIPROFLOXACIN HCL 500 MG PO TABS: ORAL_TABLET | ORAL | 0 refills | Status: DC

## 2020-04-13 MED ORDER — TAMSULOSIN HCL 0.4 MG PO CAPS: ORAL_CAPSULE | ORAL | 11 refills | Status: DC

## 2020-04-13 NOTE — Progress Notes (Signed)
    Subjective:    Patient ID: Ian Lopez, male   DOB: 03-16-1966, 55 y.o.   MRN: [id number]   HPI   1.  BPH:  Took Tamsulosin and Dutasteride daily for 2 months and then stopped.  States nocturia decreased from 6 to 4.  Also describes having hesitancy and decreased flow.   While on the meds, he urinated the same number of times during the day--6 times.   Since he stopped taking the meds--12/27, his nocturia is worse again and not getting any sleep.   Denies any burning with urination. Has never been told he has prostatitis.   He is single.  No intercourse for past 7-8 months.   No penile discharge. States his symptoms are the same as when he was diagnosed in [place] and started on same meds in 2019.  2.  Chest wall pain:  Not bothering him as much now.  Meloxicam did help when needed.    3.  Allergies:   Flonase helps.  Only using Xyzal when needed and has not for at least 1 month  4.  Epigastric burning with NSAIDS at times:  Doing well with Famotidine.  Has not taken Meloxicam for 1 month, so has not needed the Famotidine.  Just seems to have stopped everything in December.  5.  ED:  He and his wife divorced, so has not been sexually active  6.  Low HDL:  Not physically active.  Discussed healthy diet again.  7.  Prediabetes:  Describes high carb diet.  Current Meds  Medication Sig   acetaminophen (TYLENOL) 500 MG tablet Take 500 mg by mouth every 6 (six) hours as needed for pain.   fluticasone (FLONASE) 50 MCG/ACT nasal spray Place 2 sprays into both nostrils daily.  Xyzal 5 mg daily as needed  No Known Allergies   Review of Systems    Objective:   BP 132/90 (BP Location: Right Arm, Patient Position: Sitting, Cuff Size: Normal)   Pulse 60   Resp 12   Ht 5' 10.5" (1.791 m)   Wt 216 lb 8 oz (98.2 kg)   BMI 30.63 kg/m   Physical Exam NAD HEENT:  PERRL, EOMI, conjunctivae without injection, nasal mucosa without signficant swelling.  No discharge.  TMs pearly  gray. Neck:  Supple, no adenopathy Chest:  CTA CV:  RRR without murmur or rub.  Radial pulses normal and equal GU:  Prostate smooth and quite enlarged, also quite tender. Assessment & Plan   Chronic prostatitis and BPH:   Cipro 500 mg twice daily for 28 days. Restart Tamsulosin 0.4 mg daily with Dutasteride 0.5 mg daily.   Increase Tamsulosin to 0.8 mg in 7 days. Follow up in 3 months  2.  ED:  Hold on medication for now as not active.  3.  Allergies:  continue Flonase daily and Xyzal as needed.  4.  Chest wall pain:  resolved--Meloxicam as needed.  5.  Epigastric burning:  resolved when not using NSAIDS.  Famotidine as needed.  6.  Dyslipidemia and prediabetes: encouraged making small incremental goals with diet and physical activity.  7.  HM:  Pfizer booster next Monday.

## 2020-07-26 ENCOUNTER — Other Ambulatory Visit: Payer: Self-pay

## 2020-07-26 ENCOUNTER — Ambulatory Visit: Payer: Self-pay | Admitting: Internal Medicine

## 2020-07-26 ENCOUNTER — Encounter: Payer: Self-pay | Admitting: Internal Medicine

## 2020-07-26 VITALS — BP 112/80 | HR 60 | Resp 18 | Ht 70.5 in | Wt 219.0 lb

## 2020-07-26 DIAGNOSIS — R079 Chest pain, unspecified: Secondary | ICD-10-CM

## 2020-07-26 DIAGNOSIS — J3089 Other allergic rhinitis: Secondary | ICD-10-CM

## 2020-07-26 DIAGNOSIS — N400 Enlarged prostate without lower urinary tract symptoms: Secondary | ICD-10-CM

## 2020-07-26 MED ORDER — PREDNISONE 20 MG PO TABS: ORAL_TABLET | ORAL | 0 refills | Status: DC

## 2020-07-26 MED ORDER — OLOPATADINE HCL 0.2 % OP SOLN: OPHTHALMIC | 11 refills | Status: AC

## 2020-07-26 MED ORDER — MELOXICAM 15 MG PO TABS: 15.0000 mg | ORAL_TABLET | Freq: Every day | ORAL | 0 refills | Status: DC

## 2020-07-26 MED ORDER — MONTELUKAST SODIUM 10 MG PO TABS: 10.0000 mg | ORAL_TABLET | Freq: Every day | ORAL | 11 refills | Status: DC

## 2020-07-26 NOTE — Progress Notes (Signed)
Subjective:    Patient ID: Ian Lopez, male   DOB: 1965/06/18, 55 y.o.   MRN: [id number]   HPI   1.  BPH/chronic prostatitis:  Was seen in January.  Noted significant decrease in nocturia--from about 6-7 times to  2-3 times.  States he still has to get up at 2-3 times in the night to urinate.   He admits to drinking Lipton tea and water while he works on computer at night, often to 11 p.m. Urine stream is up and down, but generally much better.   Rare urinary hesitancy.    2.  Seasonal allergies:  Windows closed in home.  Does leave shoes at door.  States he is taking Flonase regularly.  States he is taking Xyzal.  Has picked up Opcon A as well.   Describes difficulties with breathing in his chest with breathing.   He has not applied for an orange card. In years past, has been given inhaled albuterol for this.    3.  Chest wall pain:  Brings this up at end of visit:  Filled his Meloxicam a few days ago, but lost the prescription.  States the medicine helps the chest wall pain.  He previously told me he wasn't taking any other medications than those listed under current meds.    Current Meds  Medication Sig  . acetaminophen (TYLENOL) 500 MG tablet Take 500 mg by mouth every 6 (six) hours as needed for pain.  Marland Kitchen dutasteride (AVODART) 0.5 MG capsule Take 1 capsule (0.5 mg total) by mouth daily.  . famotidine (PEPCID) 40 MG tablet Take 1 tablet (40 mg total) by mouth at bedtime.  . fluticasone (FLONASE) 50 MCG/ACT nasal spray Place 2 sprays into both nostrils daily.  Marland Kitchen levocetirizine (XYZAL) 5 MG tablet Take 1 tablet (5 mg total) by mouth every evening.  . Naphazoline-Pheniramine (OPCON-A) 0.027-0.315 % SOLN Apply 1 drop to eye 4 (four) times daily as needed.  . tamsulosin (FLOMAX) 0.4 MG CAPS capsule 2 caps by mouth daily at bedtime   No Known Allergies   Review of Systems    Objective:   BP 112/80 (BP Location: Left Arm, Patient Position: Sitting, Cuff Size: Large)    Pulse 60   Resp 18   Ht 5' 10.5" (1.791 m)   Wt 219 lb (99.3 kg)   BMI 30.98 kg/m   Sa)2 97% on RA  Physical Exam   NAD HEENT:  conjunctivae with injection, though more prominent on what blebs and thickened tissue that extends onto irises both nasally and temporally.  TMs pearly gray, nasal mucosa boggy and with clear discharge.  Throat with mild erythema, no exudate.  Unable to see posterior pharynx well. Neck:  Supple, No adenopathy Chest:  CTA without wheeze CV:  RRR without murmur or rub.  No carotid bruit.  Carotid, radial and DP pulses normal and equal LE:  No edema.   Assessment & Plan  1.  BPH and likely chronic prostatitis:  Improved with treatment.  Consider discontinuing Flomax in July if doing well.   Stop drinking fluid, particularly tea or coffee after dinner.  2.  Allergies:  Prednisone 20 mg daily for 5 days course.  In meantime, to get started on pataday eye drops 1 gtt ou twice daily and Singulair 10 mg daily.   Will need orange card for affordable Albuterol.  Encouraged him to in future get rid of old medications and share all that he uses even on an as  needed basis. Sign up for orange card so can send for inhaler if needed.  3.  Chest wall pain:  He did not know what Meloxicam was or what he was using it for when asked.  Encouraged to write out all meds on index card and what he takes them for.  Refilled Meloxicam.

## 2020-08-02 ENCOUNTER — Ambulatory Visit: Payer: Self-pay | Admitting: Internal Medicine

## 2021-01-25 ENCOUNTER — Encounter: Payer: Self-pay | Admitting: Internal Medicine

## 2021-01-25 ENCOUNTER — Other Ambulatory Visit: Payer: Self-pay

## 2021-01-25 ENCOUNTER — Ambulatory Visit: Payer: Self-pay | Admitting: Internal Medicine

## 2021-01-25 VITALS — BP 148/94 | HR 68 | Resp 20 | Ht 70.5 in | Wt 216.0 lb

## 2021-01-25 DIAGNOSIS — B351 Tinea unguium: Secondary | ICD-10-CM

## 2021-01-25 DIAGNOSIS — R3915 Urgency of urination: Secondary | ICD-10-CM | POA: Insufficient documentation

## 2021-01-25 DIAGNOSIS — R350 Frequency of micturition: Secondary | ICD-10-CM

## 2021-01-25 DIAGNOSIS — N411 Chronic prostatitis: Secondary | ICD-10-CM

## 2021-01-25 DIAGNOSIS — R030 Elevated blood-pressure reading, without diagnosis of hypertension: Secondary | ICD-10-CM

## 2021-01-25 DIAGNOSIS — N400 Enlarged prostate without lower urinary tract symptoms: Secondary | ICD-10-CM

## 2021-01-25 DIAGNOSIS — Z23 Encounter for immunization: Secondary | ICD-10-CM

## 2021-01-25 MED ORDER — DUTASTERIDE 0.5 MG PO CAPS: 0.5000 mg | ORAL_CAPSULE | Freq: Every day | ORAL | 11 refills | Status: AC

## 2021-01-25 MED ORDER — OXYBUTYNIN CHLORIDE ER 10 MG PO TB24: 10.0000 mg | ORAL_TABLET | Freq: Every day | ORAL | 11 refills | Status: AC

## 2021-01-25 MED ORDER — LEVOCETIRIZINE DIHYDROCHLORIDE 5 MG PO TABS: 5.0000 mg | ORAL_TABLET | Freq: Every evening | ORAL | 11 refills | Status: AC

## 2021-01-25 MED ORDER — FLUTICASONE PROPIONATE 50 MCG/ACT NA SUSP: 2.0000 | Freq: Every day | NASAL | 11 refills | Status: AC

## 2021-01-25 NOTE — Patient Instructions (Signed)
Get your orange card filled out!!!  Drink a glass of water before every meal Drink 6-8 glasses of water daily Eat three meals daily Eat a protein and healthy fat with every meal (eggs,fish, chicken, [place] and limit red meats) Eat 5 servings of vegetables daily, mix the colors Eat 2 servings of fruit daily with skin, if skin is edible Use smaller plates Put food/utensils down as you chew and swallow each bite Eat at a table with friends/family at least once daily, no TV Do not eat in front of the TV  Recent studies show that people who consume all of their calories in a 12 hour period lose weight more efficiently.  For example, if you eat your first meal at 7:00 a.m., your last meal of the day should be completed by 7:00 p.m.

## 2021-01-25 NOTE — Progress Notes (Signed)
Subjective:    Patient ID: Ian Lopez, male   DOB: 10-Jun-1965, 55 y.o.   MRN: [id number]   HPI  Here for Male CPE:  1.  STE:  Does not perform.  No family history of testicular cancer.    2.  PSA: Last performed 10/2020 in [place] and less than 2 mg/ml.  No family history of prostate cancer.   3.  Guaiac Cards/FIT:    4.  Colonoscopy: Never.  No family history of colon cancer.    5.  Cholesterol/Glucose:  Cholesterol with dyslipidemia 1 year ago and normal total at 170.  A1C mildly elevated into prediabetic range 1 year ago at 5.8% and in [place] up to 5.9% in July.    6.  Immunizations:  Has not had influenza vaccine this year.  He has had one COVID booster.  Has not had a tetanus vaccine in past 10 years. Has not had shingles vaccine.   Other:  BPH and chronic prostatitis:  After restarted on Tamsulosin and Dutasteride in April, he continued to have significant nocturia.  Not much of a problem during daytime.  Went back to [place] in July for his father's funeral and was seen by another physician while there.  He was placed on Levofloxacin for 10 days and Solifenacin 5 mg, 2 tabs daily for 5 days.  States his nocturia was down to 1-2 times nightly for about 1 month thereafter.  Now he is back to needing to urinate 7-9 times at night.  He also now has to urinate 7-8 times daily.  He does at times have urine hesitancy and decrease urine flow.   May have minimal urine output or a lot. He denies incomplete emptying, but 30 minutes later, feels urge to urinate again.   He has never had urinary incontinence He does not have an orange card.    No Known Allergies  Past Medical History:  Diagnosis Date   BPH (benign prostatic hyperplasia) 05/2019   treated in [place]--medication for 3 months   Chronic prostatitis    Dyslipidemia (high LDL; low HDL) 02/03/2020   Obesity (BMI 30-39.9)    Prediabetes 2016   Urinary urgency     History reviewed. No pertinent surgical  history.  Family History  Problem Relation Age of Onset   Hypertension Sister    Hypertension Sister     Family Status  Relation Name Status   Mother  Alive, age 45y   Father  Deceased at age 16   Sister  Alive   Sister  Alive   Brother  Deceased       Uncertain cause of death   Brother  Deceased       Uncertain cause of death   Daughter  Alive   Daughter  Alive   Daughter  Alive   Son  Alive   Son  Alive   Son  Alive   Son  Alive    Social History   Socioeconomic History   Marital status: Divorced    Spouse name: Both exes   Number of children: 7   Years of education: 12   Highest education level: 12th grade  Occupational History   Occupation: [employer].  Tobacco Use   Smoking status: Never   Smokeless tobacco: Never  Vaping Use   Vaping Use: Never used  Substance and Sexual Activity   Alcohol use: No   Drug use: No   Sexual activity: Yes    Birth control/protection: None  Comment: male partner not young enough for child bearing.  Other Topics Concern   Not on file  Social History Narrative   Lives alone and divorced from wife, but they still get together at times.     Both X wives last names are Murrell Converse is a patient here   Social Determinants of Corporate investment banker Strain: Not on file  Food Insecurity: Not on file  Transportation Needs: Not on file  Physical Activity: Not on file  Stress: Not on file  Social Connections: Not on file  Intimate Partner Violence: Not on file      Review of Systems  HENT:         Cold sores on upper lip starting 3 days ago.  Has had this once before.  Respiratory:  Negative for shortness of breath.   Cardiovascular:  Negative for chest pain, palpitations and leg swelling.  See "other"  above for urinary complaints.    Objective:   BP (!) 148/94 (BP Location: Left Arm, Patient Position: Sitting, Cuff Size: Normal)   Pulse 68   Resp 20   Ht 5' 10.5" (1.791 m)   Wt 216 lb (98 kg)    BMI 30.55 kg/m   Physical Exam HENT:     Head: Normocephalic.     Right Ear: Tympanic membrane, ear canal and external ear normal.     Left Ear: Tympanic membrane, ear canal and external ear normal.     Nose: Nose normal.     Mouth/Throat:     Mouth: Mucous membranes are moist.     Pharynx: Oropharynx is clear.     Comments: Scabbed lesions on mainly left lower lip. Eyes:     Extraocular Movements: Extraocular movements intact.     Pupils: Pupils are equal, round, and reactive to light.     Comments: White tissue overgrowing edges of iris both from nasal and temporal sides of bilateral eyes.  Minimal tension on underlying tissue.  Does not appear to affect vision.  Neck:     Thyroid: No thyroid mass or thyromegaly.  Cardiovascular:     Rate and Rhythm: Normal rate and regular rhythm.     Heart sounds: S1 normal and S2 normal. No murmur heard.   No friction rub. No S3 or S4 sounds.     Comments: No carotid bruits.  Carotid, radial, femoral, DP and PT pulses normal and equal.    Pulmonary:     Effort: Pulmonary effort is normal.     Breath sounds: Normal breath sounds.  Abdominal:     General: Bowel sounds are normal.     Palpations: Abdomen is soft. There is no hepatomegaly, splenomegaly or mass.     Tenderness: There is no abdominal tenderness.     Hernia: No hernia is present.     Comments: Abdomen resonant to percussion over suprapubic area to umbilicus.  Genitourinary:    Penis: Normal.      Testes:        Right: Mass or tenderness not present. Right testis is descended.        Left: Mass or tenderness not present. Left testis is descended.  Musculoskeletal:        General: Normal range of motion.     Cervical back: Normal range of motion and neck supple.     Right lower leg: No edema.     Left lower leg: No edema.     Right foot: Bunion (mild) present.  Left foot: Bunion (mild) present.  Feet:     Right foot:     Skin integrity: Skin integrity normal.      Toenail Condition: Right toenails are abnormally thick. Fungal disease present.    Left foot:     Skin integrity: Skin integrity normal.     Toenail Condition: Left toenails are abnormally thick. Fungal disease present. Lymphadenopathy:     Head:     Right side of head: No submental or submandibular adenopathy.     Left side of head: No submental or submandibular adenopathy.     Cervical: No cervical adenopathy.     Upper Body:     Right upper body: No supraclavicular or axillary adenopathy.     Left upper body: No supraclavicular or axillary adenopathy.     Lower Body: No right inguinal adenopathy. No left inguinal adenopathy.  Skin:    General: Skin is warm.     Capillary Refill: Capillary refill takes less than 2 seconds.     Findings: No rash.  Neurological:     General: No focal deficit present.     Mental Status: He is alert and oriented to person, place, and time.     Cranial Nerves: Cranial nerves are intact.     Sensory: Sensation is intact.     Motor: Motor function is intact.     Coordination: Coordination is intact.     Gait: Gait is intact.     Deep Tendon Reflexes: Reflexes are normal and symmetric.  Psychiatric:        Attention and Perception: Attention normal.        Mood and Affect: Mood normal.        Speech: Speech normal.        Behavior: Behavior normal. Behavior is cooperative.     Assessment & Plan    CPE No influenza available today--to obtain in 2 weeks with labs and bp check Pfizer COVID bivalent booster Shingrix; repeat in 2-6 months. Tdap Had normal PSA in [place] in July Return for fasting labs, FIT testing   2.  BPH/chronic prostatitis:  has been treated since at least April continuously for BPH with Tamsulosin and Dutasteride and was also given a 28 day treatment of Cipro for chronic prostatitis.   When in [place], had a short round of Levfloxacin and anticholinergic, solafenacin.  The latter seemed to help his urinary symptoms the best,  so will add to BPH meds--Oxybutynin XL 10 mg daily. Referral to Urology, though needs orange card first.  3.  Dyslipidemia/Prediabetes:  Encouraged to continue making incremental small goals with eating and physical activity.  Labs in 2 weeks.  4.  Oral herpetic lesions:  appear to be healing and likely will not respond faster to anti virals at this point.  5.  Elevated BP:  recheck with labs in 2 weeks.

## 2021-01-26 ENCOUNTER — Other Ambulatory Visit: Payer: Self-pay | Admitting: Internal Medicine

## 2021-02-03 ENCOUNTER — Other Ambulatory Visit (INDEPENDENT_AMBULATORY_CARE_PROVIDER_SITE_OTHER): Payer: Self-pay

## 2021-02-03 ENCOUNTER — Other Ambulatory Visit: Payer: Self-pay

## 2021-02-03 DIAGNOSIS — Z1211 Encounter for screening for malignant neoplasm of colon: Secondary | ICD-10-CM

## 2021-02-03 LAB — POC FIT TEST STOOL: Fecal Occult Blood: NEGATIVE

## 2021-05-10 ENCOUNTER — Other Ambulatory Visit: Payer: Self-pay | Admitting: Internal Medicine

## 2021-05-17 ENCOUNTER — Other Ambulatory Visit: Payer: Self-pay | Admitting: Internal Medicine

## 2021-09-05 ENCOUNTER — Other Ambulatory Visit: Payer: Self-pay | Admitting: Internal Medicine

## 2022-02-07 ENCOUNTER — Other Ambulatory Visit: Payer: Self-pay | Admitting: Internal Medicine

## 2022-03-31 ENCOUNTER — Other Ambulatory Visit: Payer: Self-pay | Admitting: Internal Medicine
